# Patient Record
Sex: Female | Born: 2005 | Race: Black or African American | Hispanic: No | Marital: Single
Health system: Midwestern US, Community
[De-identification: ages and names within clinical notes are randomized; demographics above are authoritative.]

## PROBLEM LIST (undated history)

## (undated) DIAGNOSIS — Z021 Encounter for pre-employment examination: Secondary | ICD-10-CM

## (undated) DIAGNOSIS — R102 Pelvic and perineal pain: Principal | ICD-10-CM

## (undated) DIAGNOSIS — R112 Nausea with vomiting, unspecified: Principal | ICD-10-CM

## (undated) DIAGNOSIS — S62623A Displaced fracture of medial phalanx of left middle finger, initial encounter for closed fracture: Secondary | ICD-10-CM

## (undated) DIAGNOSIS — M79645 Pain in left finger(s): Secondary | ICD-10-CM

## (undated) DIAGNOSIS — Z0289 Encounter for other administrative examinations: Secondary | ICD-10-CM

---
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
## 2024-02-12 DIAGNOSIS — N644 Mastodynia: Secondary | ICD-10-CM

## 2024-02-12 NOTE — Discharge Instructions (Addendum)
(  1)  Acetaminophen  and/or Ibuprofen   (2)  Cephalexin for 7 days  (3)  U/S will call for outpatient U/S of left breast  (4)  call your PCP for appt for recheck ASAP of left breast pain and review U/S results.  Discuss referral to Dr Rory Collard from GYN.

## 2024-02-12 NOTE — ED Triage Notes (Signed)
 C/o pain to palpation to left breast x 5 days. Pt states she thought it was because of her 'period',states she just got over her menses. Denies being sexually  active. No redness noted, but very tender to touch

## 2024-02-12 NOTE — ED Notes (Signed)
 Patient is alert, oriented, calm and cooperative.   Patient ambulates with a steady gait independently.  Medication, discharge instructions, and follow up care reviewed with patient and family who verbalized understanding.  Vital signs are stable.  RN, patient, MD, and family in agreement with discharge plan, no safety concerns noted.

## 2024-02-12 NOTE — ED Provider Notes (Signed)
 Chief Complaint   Patient presents with    Breast Pain       This is a 18 year old female who is here with left breast pain.      History is obtained per the patient as well as review of her records.      According to records, she has history of adjustment disorder, dental disorder, leukopenia, dysmenorrhea and intermittent asthma.      She adamantly denied being sexually active.      She noted that over the last 5 days she has had increasing pain and swelling to the left breast area.  It is at the top.  She denies any injury or trauma.  She denies any nipple discharge.  She states that she has no symptoms on the right side.  She denies any shortness of breath or chest pain.  She denies any fevers, belly pain or dysuria.            Medical History:    Current Problem List:   Patient Active Problem List   Diagnosis    Adjustment disorder with depressed mood    Dental disorder    Leucopenia    Dysmenorrhea    Delayed immunizations    Mild intermittent asthma without complication    Closed nondisplaced fracture of middle phalanx of left little finger    Closed displaced fracture of middle phalanx of left little finger with delayed healing       Past Medical History (No pertinent Med/Surg/Social history unless listed below or reported in HPI):    No past medical history on file.    No past surgical history on file.    Social History     Socioeconomic History    Marital status: Single     Spouse name: Not on file    Number of children: Not on file    Years of education: Not on file    Highest education level: Not on file   Occupational History    Not on file   Tobacco Use    Smoking status: Never     Passive exposure: Never    Smokeless tobacco: Never   Vaping Use    Vaping status: Never Used   Substance and Sexual Activity    Alcohol use: Never    Drug use: Never    Sexual activity: Never   Other Topics Concern    Not on file   Social History Narrative    Born in [place], went to school consistently there  Grandmother  Fadumo Dom has been in [place] 20 years, pt and her mother joined her 2022  10th grade at [employer]  No hx tobacco, substance or etoh use     Social Drivers of Psychologist, prison and probation services Strain: Not on file   Food Insecurity: Not on file (04/08/2022)   Transportation Needs: Not on file   Physical Activity: Not on file   Stress: Not on file   Social Connections: Not on file   Intimate Partner Violence: Not on file   Housing Stability: Not on file       Family History (No pertinent history unless listed below or reported in HPI):    Family History   Problem Relation Age of Onset    Asthma Maternal Grandmother        Medications:    Patient medication list reviewed:  Patient's Medications   New Prescriptions    CEPHALEXIN (KEFLEX) 500 MG CAPSULE  Take 1 capsule by mouth 4 times daily for 7 days   Previous Medications    ACETAMINOPHEN  (TYLENOL ) 500 MG TABLET    Take 1 tablet by mouth 4 times daily as needed for Pain    ALBUTEROL  SULFATE HFA (PROVENTIL ;VENTOLIN ;PROAIR ) 108 (90 BASE) MCG/ACT INHALER    Inhale 2 puffs into the lungs every 6 hours as needed    CYPROHEPTADINE  (PERIACTIN ) 4 MG TABLET    1 tab PO once nightly    FAMOTIDINE  (PEPCID ) 20 MG TABLET    Take 1 tablet by mouth 2 times daily for 5 days    FERROUS SULFATE  (IRON 325) 325 (65 FE) MG TABLET    Take 1 tablet by mouth daily (with breakfast)    IBUPROFEN  (ADVIL ;MOTRIN ) 600 MG TABLET    Take 1 tablet by mouth 4 times daily as needed for Pain    NAPROXEN  (NAPROSYN ) 500 MG TABLET    Take 1 tablet by mouth 2 times daily as needed for Pain    NORELGESTROMIN -ETHINYL ESTRADIOL  (XULANE) 150-35 MCG/24HR    Place 1 patch onto the skin once a week    NORGESTIMATE -ETHINYL ESTRADIOL  (SPRINTEC 28) 0.25-35 MG-MCG PER TABLET    Take 1 tablet by mouth daily    SPACER/AERO-HOLDING CHAMBERS DEVI    1 Device by Does not apply route as needed (Use with inhaler for wheezing) Use the spacer any time your using your inhaler    VITAMIN D  (ERGOCALCIFEROL ) 1.25 MG (50000 UT) CAPS  CAPSULE    Take 1 capsule by mouth once a week   Modified Medications    No medications on file   Discontinued Medications    No medications on file       Anticoagulants / Antiplatelet medications:  This patient does not have an active medication from one of the medication groupers.    PDMP Review:       No data to display                Allergies:      Patient has no known allergies.    Review of Systems   Constitutional:  Negative for fever.   HENT:  Negative for sore throat.    Respiratory:  Negative for shortness of breath.    Cardiovascular:  Negative for chest pain.   Gastrointestinal:  Negative for abdominal pain.   Genitourinary:  Negative for dysuria.   Neurological:  Negative for headaches.         Vitals:    02/12/24 2018   BP: 94/60   Pulse: 70   Resp: 18   Temp: 97.4 F (36.3 C)   SpO2: 98%   Weight: 52.2 kg (115 lb)   Height: 1.676 m (5\' 6" )     Physical Exam  Vitals and nursing note reviewed.   Cardiovascular:      Rate and Rhythm: Normal rate.      Pulses: Normal pulses.   Musculoskeletal:         General: Tenderness present.      Cervical back: Normal range of motion.      Comments: In the left breast area proximally at the 11 o'clock position (as viewing the patient), there is tenderness and swelling without fluctuance.  There is no peau d'orange.  There is no nipple discharge.  There is no erythema or warmth.   Skin:     General: Skin is warm.      Capillary Refill: Capillary refill takes less than 2 seconds.  Medical Decision Making:    Differential Diagnosis:    MDM:          Medical Decision Making  This is a 18 year old female who is here with 5 days of nontraumatic left breast pain.      Vital signs show temperature 97.4 with heart rate 70 and blood pressure 94/60.  She is 98% on room air.      Clinical exam shows an alert female in no distress.  I obtained consent for examination of the breast area.  With a nurse chaperone, I was able to visualize the area of tenderness.  It is at  the 11 o'clock position.  There is tenderness and a mass that is soft that was palpated.  There is no fluctuance.  There is no erythema.  There is no put her on change or nipple discharge.      My differential diagnosis includes inflammation, infection such as mastitis.  I do not think she is septic.  She has no cardiac symptoms and no pulmonary symptoms.      I discussed my findings and concerns with the patient.  The plan will be for acetaminophen  and ibuprofen .  I will start the patient on cephalexin  500 mg 4 times a day for the next 7 days although I am not 100% convinced this is mastitis this should cover this.  I will schedule the patient for outpatient breast ultrasound to look for a mass.  She will need close follow with her PCP and will need to be re-evaluated and discuss referral to gyn.      Final discharge diagnosis left breast pain.    Amount and/or Complexity of Data Reviewed  Radiology: ordered.    Risk  Prescription drug management.        ED Course:         Procedures    Vital Signs for this visit:    Patient Vitals for the past 12 hrs:   Temp Pulse Resp BP SpO2   02/12/24 2018 97.4 F (36.3 C) 70 18 94/60 98 %       Lab findings during this visit (only abnormal values will be noted, if no value noted then the result was normal range):    Labs Reviewed - No data to display    Radiology studies during this visit:    US  BREASt COMPLETE LEFT    (Results Pending)       No results found.    Medications given in the ED:    Medications   ibuprofen  (ADVIL ;MOTRIN ) tablet 600 mg (has no administration in time range)   cephALEXin  (KEFLEX ) 500 MG 4 capsule *(TAKE HOME)* (has no administration in time range)       Diagnosis:    Final diagnoses:   Breast pain, left       Disposition:    Decision To Discharge 02/12/2024 09:14:10 PM    Discharge prescriptions and/or changes if applicable:       Medication List        START taking these medications      cephALEXin  500 MG capsule  Commonly known as: KEFLEX   Take 1  capsule by mouth 4 times daily for 7 days            CONTINUE taking these medications      Spacer/Aero-Holding Ismael Maria  1 Device by Does not apply route as needed (Use with inhaler for wheezing) Use the spacer any time your using your inhaler  ASK your doctor about these medications      acetaminophen  500 MG tablet  Commonly known as: TYLENOL   Take 1 tablet by mouth 4 times daily as needed for Pain     albuterol  sulfate HFA 108 (90 Base) MCG/ACT inhaler  Commonly known as: PROVENTIL ;VENTOLIN ;PROAIR      cyproheptadine  4 MG tablet  Commonly known as: PERIACTIN   1 tab PO once nightly     famotidine  20 MG tablet  Commonly known as: Pepcid   Take 1 tablet by mouth 2 times daily for 5 days     ferrous sulfate  325 (65 Fe) MG tablet  Commonly known as: IRON 325  Take 1 tablet by mouth daily (with breakfast)     ibuprofen  600 MG tablet  Commonly known as: ADVIL ;MOTRIN   Take 1 tablet by mouth 4 times daily as needed for Pain     naproxen  500 MG tablet  Commonly known as: NAPROSYN   Take 1 tablet by mouth 2 times daily as needed for Pain     norelgestromin -ethinyl estradiol  150-35 MCG/24HR  Commonly known as: XULANE  Place 1 patch onto the skin once a week     norgestimate -ethinyl estradiol  0.25-35 MG-MCG per tablet  Commonly known as: Sprintec 28  Take 1 tablet by mouth daily     vitamin D  1.25 MG (50000 UT) Caps capsule  Commonly known as: ERGOCALCIFEROL   Take 1 capsule by mouth once a week               Where to Get Your Medications        These medications were sent to [id number] - [place], [place] - [address] - P [phone] - F [phone]  [address] [place] [address]      Phone: [phone]   cephALEXin 500 MG capsule         Follow-up if applicable:    Melchor Spoon, FNP  [address]  [place] [address]  [phone]    Call       Coulombe, Fulton Job, MD  [address]  [address]  [place] [address]  [phone]    Call         Please note that portions of this  document were created using the M*Modal Fluency Direct dictation system.  Any inconsistencies or typographical errors may be the result of mis-transcription that persist in spite of proof-reading and should be addressed with the document creator.       Levis Reams, MD  02/13/24 [phone]

## 2024-02-13 ENCOUNTER — Inpatient Hospital Stay
Admit: 2024-02-13 | Discharge: 2024-02-13 | Disposition: A | Discharge status: Home / Self Care | Payer: MEDICAID | Attending: Emergency Medicine

## 2024-02-13 MED ORDER — CEPHALEXIN 500 MG PO CAPS
500 | ORAL_CAPSULE | Freq: Four times a day (QID) | ORAL | 0 refills | 7.00 days | Status: AC
Start: 2024-02-13 — End: 2024-02-19

## 2024-02-13 MED ORDER — CEPHALEXIN 500 MG PO CAPS
500 | ORAL | Status: DC
Start: 2024-02-13 — End: 2024-02-12
  Administered 2024-02-13: 01:00:00 via ORAL

## 2024-02-13 MED ORDER — IBUPROFEN 400 MG PO TABS
400 | ORAL | Status: AC
Start: 2024-02-13 — End: 2024-02-12
  Administered 2024-02-13: 01:00:00 600 mg via ORAL

## 2024-02-13 MED FILL — CEPHALEXIN 500 MG PO CAPS: 500 MG | ORAL | Qty: 4 | Fill #0

## 2024-02-13 MED FILL — IBUPROFEN 200 MG PO TABS: 200 MG | ORAL | Qty: 1 | Fill #0

## 2024-03-14 ENCOUNTER — Emergency Department: Admit: 2024-03-14 | Payer: MEDICAID | Primary: Family

## 2024-03-14 ENCOUNTER — Inpatient Hospital Stay
Admit: 2024-03-14 | Discharge: 2024-03-15 | Disposition: A | Discharge status: Home / Self Care | Payer: MEDICAID | Attending: Emergency Medicine

## 2024-03-14 DIAGNOSIS — S199XXA Unspecified injury of neck, initial encounter: Secondary | ICD-10-CM

## 2024-03-14 MED ORDER — IBUPROFEN 400 MG PO TABS
400 | ORAL | Status: AC
Start: 2024-03-14 — End: 2024-03-14
  Administered 2024-03-14: 400 mg via ORAL

## 2024-03-14 MED FILL — IBUPROFEN 400 MG PO TABS: 400 MG | ORAL | Qty: 1

## 2024-03-14 NOTE — ED Triage Notes (Signed)
 CC of falling down interior stairs this morning, no LOC, denies numbness/tingling, took tylenol  and now neck pain on R side shoulder pain as well, states can't raise arm above head. Independently ambulatory into triage with steady gait. VSS

## 2024-03-14 NOTE — ED Notes (Signed)
 Patient is alert, oriented, calm, and acting appropriately for their age.    Patient ambulates with a steady gait independently.    Medication, discharge instructions, and follow up care reviewed with Pt who verbalized understanding.    Vital signs are stable.    Paramedic, Provider, and Pt in agreement with discharge plan, no safety concerns noted.

## 2024-03-14 NOTE — Discharge Instructions (Signed)
 Preliminary review of x-ray shows no fracture.  Pain is likely due from the muscles and ligaments supporting her neck.  Try some warm compresses and massage.  Continue ibuprofen  over-the-counter 400 mg every 6-8 hours.  Muscle relaxant prescription was sent to pharmacy.  Use only before you are about to go to sleep.  A work note will be given to you.    Notify PCP or return to ER for any worsening symptoms.

## 2024-03-14 NOTE — ED Provider Notes (Signed)
 Chief Complaint:    HPI  18 year old here after fall.  She works night shifts.  She finished work at 6:00 a.m.SABRA  She went to bed she got up mid morning and slipped while she was on some stairs.  She did not fall down the stairs.  She caught herself with a hand rail and then she thinks her head and neck came in contact with either the wall or the railing.  She had some discomfort to her right neck.  She went back to bed.  She did not lose consciousness.  She took some Tylenol .  Mom tried massage.  She is having ongoing right-sided neck pain worse with movement better at rest.  Denies any weakness or numbness to her fingers or hands.  Denies any vision change denies any lower back pain or lower extremity pain.        Medical History:  Current Problem List:   Patient Active Problem List   Diagnosis    Adjustment disorder with depressed mood    Dental disorder    Leucopenia    Dysmenorrhea    Delayed immunizations    Mild intermittent asthma without complication    Closed nondisplaced fracture of middle phalanx of left little finger    Closed displaced fracture of middle phalanx of left little finger with delayed healing       Past Medical History:  No past medical history on file.    No past surgical history on file.    Social History     Socioeconomic History    Marital status: Single     Spouse name: Not on file    Number of children: Not on file    Years of education: Not on file    Highest education level: Not on file   Occupational History    Not on file   Tobacco Use    Smoking status: Never     Passive exposure: Never    Smokeless tobacco: Never   Vaping Use    Vaping status: Never Used   Substance and Sexual Activity    Alcohol use: Never    Drug use: Never    Sexual activity: Never   Other Topics Concern    Not on file   Social History Narrative    Born in [place], went to school consistently there  Grandmother Fadumo Eaves has been in [place] 20 years, pt and her mother joined her 2022  10th grade at [employer]  No  hx tobacco, substance or etoh use     Social Drivers of Psychologist, prison and probation services Strain: Not on file   Food Insecurity: Not on file (04/08/2022)   Transportation Needs: Not on file   Physical Activity: Not on file   Stress: Not on file   Social Connections: Not on file   Intimate Partner Violence: Not on file   Housing Stability: Not on file       Family History:  Family History   Problem Relation Age of Onset    Asthma Maternal Grandmother        Medications:  Patient medication list reviewed  Discharge Medication List as of 03/14/2024  9:11 PM        CONTINUE these medications which have NOT CHANGED    Details   norelgestromin -ethinyl estradiol  (XULANE) 150-35 MCG/24HR Place 1 patch onto the skin once a week, Disp-3 patch, R-3Normal      norgestimate -ethinyl estradiol  (SPRINTEC 28) 0.25-35 MG-MCG per tablet Take 1  tablet by mouth daily, Disp-3 packet, R-1Normal      ferrous sulfate  (IRON 325) 325 (65 Fe) MG tablet Take 1 tablet by mouth daily (with breakfast), Disp-90 tablet, R-1Normal      vitamin D  (ERGOCALCIFEROL ) 1.25 MG (50000 UT) CAPS capsule Take 1 capsule by mouth once a week, Disp-12 capsule, R-1Normal      naproxen  (NAPROSYN ) 500 MG tablet Take 1 tablet by mouth 2 times daily as needed for Pain, Disp-60 tablet, R-0Normal      acetaminophen  (TYLENOL ) 500 MG tablet Take 1 tablet by mouth 4 times daily as needed for Pain, Disp-360 tablet, R-1Normal      ibuprofen  (ADVIL ;MOTRIN ) 600 MG tablet Take 1 tablet by mouth 4 times daily as needed for Pain, Disp-40 tablet, R-1Normal      cyproheptadine  (PERIACTIN ) 4 MG tablet 1 tab PO once nightly, Disp-90 tablet, R-0Normal      famotidine  (PEPCID ) 20 MG tablet Take 1 tablet by mouth 2 times daily for 5 days, Disp-10 tablet, R-0Normal      Spacer/Aero-Holding Chambers DEVI AS NEEDED Starting Sun 12/04/2022, Disp-1 each, R-0, NormalUse the spacer any time your using your inhaler      albuterol  sulfate HFA (PROVENTIL ;VENTOLIN ;PROAIR ) 108 (90 Base) MCG/ACT inhaler  Inhale 2 puffs into the lungs every 6 hours as neededHistorical Med             Anticoagulants / Antiplatelet medications:  This patient does not have an active medication from one of the medication groupers.    Allergies:    Patient has no known allergies.    Review of Systems  See history of present illness for relevant review of systems.    ED Triage Vitals [03/14/24 1813]   BP Systolic BP Percentile Diastolic BP Percentile Temp Temp src Pulse Respirations SpO2   108/70 -- -- 98.8 F (37.1 C) -- 99 20 100 %      Height Weight - Scale         1.676 m (5' 6) 54.4 kg (120 lb)           Physical Exam  General appearance healthy appearing patient sitting up in chair she is holding her head towards the left.    Vital signs reviewed.  On palpation of the C-spine there was no localized pain on palpation of the midline of the spinous processes.  She has moderate pain on palpation of the trapezius.  There is a spasm going on.  She has decreased range of motion secondary to pain.  There was no pulsatile mass.  There was no skin break.  She has no pain on palpation of her shoulder or collarbone and full range of motion of her right arm and good grip strength.    Medical Decision Making  Amount and/or Complexity of Data Reviewed  Radiology: ordered.    Risk  Prescription drug management.      18 year old here with right-sided neck pain after a fall this morning.  There was no midline spinous process tenderness but there was moderate right paravertebral spasm/trapezius spasm.  There was no bruising.  There was no neuro surgical red flags distally.  She did not hit her head or pass out.  Plan to get a screening x-ray of her C-spine and give her some ibuprofen             ED Course:  ED Course as of 03/14/24 2125   Thu Mar 14, 2024   2038 C-spine x-ray was initially reviewed by me.  I see no obvious fracture.  Formal report still pending.     [RR]      ED Course User Index  [RR] Sigurd Tanda HERO, MD       Procedures    Vital  Signs for this visit:  Vitals:    03/14/24 1813   BP: 108/70   Pulse: 99   Resp: 20   Temp: 98.8 F (37.1 C)   SpO2: 100%   Weight: 54.4 kg (120 lb)   Height: 1.676 m (5' 6)       Lab orders and findings within the past 24 hours that I have personally reviewed and interpreted during this visit.   No results found for this or any previous visit (from the past 24 hours).    Recent radiology studies including this visit.  I have personally reviewed these studies and my personal interpretation, if available, is documented in the ED Course:  XR CERVICAL SPINE (2-3 VIEWS)    (Results Pending)       Medications ordered in the ED (also given if there is an associated admin time):  Medications   cyclobenzaprine  (FLEXERIL ) tablet 10 mg (has no administration in time range)   ibuprofen  (ADVIL ;MOTRIN ) tablet 400 mg (400 mg Oral Given 03/14/24 1959)       Diagnosis:  1. Injury of neck, initial encounter            Condition at disposition:  stable    DISPOSITION Discharge - Pending Orders Complete 03/14/2024 08:44:41 PM             Discharge prescriptions and/or changes if applicable:       Medication List        CONTINUE taking these medications      Spacer/Aero-Holding Raguel French  1 Device by Does not apply route as needed (Use with inhaler for wheezing) Use the spacer any time your using your inhaler            ASK your doctor about these medications      acetaminophen  500 MG tablet  Commonly known as: TYLENOL   Take 1 tablet by mouth 4 times daily as needed for Pain     albuterol  sulfate HFA 108 (90 Base) MCG/ACT inhaler  Commonly known as: PROVENTIL ;VENTOLIN ;PROAIR      cyproheptadine  4 MG tablet  Commonly known as: PERIACTIN   1 tab PO once nightly     famotidine  20 MG tablet  Commonly known as: Pepcid   Take 1 tablet by mouth 2 times daily for 5 days     ferrous sulfate  325 (65 Fe) MG tablet  Commonly known as: IRON 325  Take 1 tablet by mouth daily (with breakfast)     ibuprofen  600 MG tablet  Commonly known as:  ADVIL ;MOTRIN   Take 1 tablet by mouth 4 times daily as needed for Pain     naproxen  500 MG tablet  Commonly known as: NAPROSYN   Take 1 tablet by mouth 2 times daily as needed for Pain     norelgestromin -ethinyl estradiol  150-35 MCG/24HR  Commonly known as: XULANE  Place 1 patch onto the skin once a week     norgestimate -ethinyl estradiol  0.25-35 MG-MCG per tablet  Commonly known as: Sprintec 28  Take 1 tablet by mouth daily     vitamin D  1.25 MG (50000 UT) Caps capsule  Commonly known as: ERGOCALCIFEROL   Take 1 capsule by mouth once a week              Follow-up if  applicable:  Denton Elveria Pounds, FNP  [address]  [place] [address]  [phone]    Call in 3 days  As needed      Please note that portions of this document were created using the M*Modal Fluency Direct dictation system.  Any inconsistencies or typographical errors may be the result of mis-transcription that persist in spite of proof-reading and should be addressed with the document creator.      Sigurd Tanda HERO, MD  03/14/24 2125

## 2024-03-15 MED ORDER — CYCLOBENZAPRINE HCL 10 MG PO TABS
10 | Freq: Once | ORAL | Status: DC
Start: 2024-03-15 — End: 2024-03-14

## 2024-03-15 MED FILL — CYCLOBENZAPRINE HCL 10 MG PO TABS: 10 MG | ORAL | Qty: 1

## 2024-06-03 ENCOUNTER — Telehealth: Admit: 2024-06-03 | Discharge: 2024-06-03 | Primary: Family

## 2024-06-03 DIAGNOSIS — R102 Pelvic and perineal pain: Principal | ICD-10-CM

## 2024-06-03 MED ORDER — HYDROXYZINE HCL 25 MG PO TABS
25 | ORAL_TABLET | Freq: Three times a day (TID) | ORAL | 0 refills | Status: AC | PRN
Start: 2024-06-03 — End: 2024-06-13

## 2024-06-03 NOTE — Telephone Encounter (Signed)
 Pt name & DOB verified.     Name of the person calling: Chalon A Hartwell  Relationship to the patient: Self  CB#:  [phone]       What is the reason for the call?: Recurring of ovarian cyst - patient reports had taken birth control to mitigate this, but stopped taking BC as she didn't like how it made her feel, too much period, painful. Felt better for one month after stopping BC.    What symptoms are you experiencing?: Sharp pain 3-4 times a day in abdominal area    How long has this been happening? (Please include date of initial symptoms): 4 weeks     If there is pain, what is the pain level on a scale of 1-10 (10 being the worst pain you have ever experienced): Depends. 8 or 9     Did a fall or other type of injury occur? If so what happened?: No    Are they requesting an appointment? : Yes      Is there anything they would like the provider or nurse to know, that was not asked?: N/A        Please route completed note to the [facility] for further triage.

## 2024-06-03 NOTE — Progress Notes (Unsigned)
 [facility]   [address]  [place] [address]      VIRTUAL TELEHEALTH VISIT (Performed via Telephone or Audio & Video)     VIRTUAL TELEHEALTH VISIT (Performed via Telephone or Audio & Video)     VISIT TYPE:  Audio only (Telephone) - patient cannot do a video visit due to (lack of equipment, data or connectivity) CALL MOBILE -   Telephone Information:   Mobile [phone]     PROVIDER LOCATION:  Home  PATIENT LOCATION AT TIME OF VISIT:    HOME: [address]  [place] [address]    PARTICIPANT(S):  Patient    BILLING CODE:  Standard E/M Billing Codes:  Billing for this visit is not time based - Bill E/M Code within LOS activity    TOTAL TIME SPENT ON ENCOUNTER:  20 minutes    Review of electronic health record:  I specifically reviewed: Hospital Note(s), Labs, and Previous Office Encounters  Problems, Meds and Allergies were reviewed.  Patient gave verbal consent for this TeleHealth visit.             CHIEF COMPLAINT   Dorothy Nelson presents for a Tele-Health visit today   .  HISTORY OF PRESENT ILLNESS     VV conducted today at Dorothy Nelson request. Two identifiers used for Dorothy Nelson and they are aware this is a telehealth appointment and consent to conducted the visit with them today.     Pelvic pain x month  No new partners, denies STI rrisk, denies pregnancy  LMP one week ago   Not taking OCPs   Increased anxiety, not sleeping well    PHYSICAL EXAM   Alert and conversant without audible cough, wheeze or SOB, answering questions clearly and concisely, no slurred speech  ROS: see HPI    MEDICATIONS     Current Outpatient Medications   Medication Sig    hydrOXYzine  HCl (ATARAX ) 25 MG tablet Take 1 tablet by mouth every 8 hours as needed for Anxiety    norelgestromin -ethinyl estradiol  (XULANE) 150-35 MCG/24HR Place 1 patch onto the skin once a week    norgestimate -ethinyl estradiol  (SPRINTEC 28) 0.25-35 MG-MCG per tablet Take 1 tablet by mouth daily    ferrous sulfate  (IRON 325) 325 (65 Fe) MG tablet Take 1 tablet by mouth  daily (with breakfast)    vitamin D  (ERGOCALCIFEROL ) 1.25 MG (50000 UT) CAPS capsule Take 1 capsule by mouth once a week    naproxen  (NAPROSYN ) 500 MG tablet Take 1 tablet by mouth 2 times daily as needed for Pain (Patient not taking: Reported on 09/12/2023)    acetaminophen  (TYLENOL ) 500 MG tablet Take 1 tablet by mouth 4 times daily as needed for Pain (Patient not taking: Reported on 09/12/2023)    ibuprofen  (ADVIL ;MOTRIN ) 600 MG tablet Take 1 tablet by mouth 4 times daily as needed for Pain (Patient not taking: Reported on 09/12/2023)    cyproheptadine  (PERIACTIN ) 4 MG tablet 1 tab PO once nightly (Patient not taking: Reported on 06/08/2023)    famotidine  (PEPCID ) 20 MG tablet Take 1 tablet by mouth 2 times daily for 5 days    Spacer/Aero-Holding Raguel DEVI 1 Device by Does not apply route as needed (Use with inhaler for wheezing) Use the spacer any time your using your inhaler (Patient not taking: Reported on 09/12/2023)    albuterol  sulfate HFA (PROVENTIL ;VENTOLIN ;PROAIR ) 108 (90 Base) MCG/ACT inhaler Inhale 2 puffs into the lungs every 6 hours as needed (Patient not taking: Reported on  09/12/2023)     No current facility-administered medications for this visit.     @DCMEDS @    There are no discontinued medications.  ALLERGIES     No Known Allergies    ACTIVE MEDICAL PROBLEMS     There is no problem list on file for this patient.    SOCIAL HISTORY     Social History     Social History Narrative    Not on file     VITALS   There were no vitals filed for this visit. - There is no height or weight on file to calculate BMI.  ORDERS & MEDS (NEW)     No orders of the defined types were placed in this encounter.    ASSESSMENT AND PLAN       1. Pelvic pain  -     Urinalysis with Reflex Micro to Culture; Future  -     C.trachomatis N.gonorrhoeae DNA; Future  -     US  PELVIS COMPLETE; Future  -     Pregnancy, Urine; Future  2. Iron deficiency anemia, unspecified iron deficiency anemia type  -     Ferritin; Future  -      Iron Profile; Future  -     CBC with Auto Differential; Future  3. Anxiety  -     hydrOXYzine  HCl (ATARAX ) 25 MG tablet; Take 1 tablet by mouth every 8 hours as needed for Anxiety, Disp-30 tablet, R-0Normal          Follow up:  No follow-ups on file.     Future Appointments   Date Time Provider Department Center   07/15/2024  1:00 PM Denton Elveria Pounds, FNP [facility]       Delon Sites, [place]  06/03/2024

## 2024-06-03 NOTE — Telephone Encounter (Signed)
 Patient name and DOB verified.   Pt declined any need for an interpreter.      Pt had called the office earlier

## 2024-06-03 NOTE — Telephone Encounter (Signed)
 NURSING DOCUMENTATION:  Pt name & dob verified at start of call.  CC:  Acute Lower one sided abdominal pain RLQ. 8/10, sharp, intermittent--now for 4 weeks.   Reports baseline Abdominal Pain for 1 week before menses  Duration: 4 weeks  Associated Symptoms: occasional nausea. Not sexually active.  dizziness, she reports exertional dyspnea.    LMP:  3 weeks ago (unsure of exact date), reports LMP was normal on time.  She does report heavy flow and lots of blood clots.  Bled for 4-5 days changes pad soaked every 3 hours for the first 2 days of her menses.  Denies:  chest pains, palpitation  Has tried:   Other: started taking OCP's, took them only for a month then stopped.    --  Pt has appt with [facility] already scheduled.  Will send triage note to [facility] provider.   --  Thank-you.    Ike HUNT RN float nurse working for [employer] today (from ext [phone])

## 2024-06-11 ENCOUNTER — Inpatient Hospital Stay: Primary: Family

## 2024-06-11 DIAGNOSIS — R102 Pelvic and perineal pain: Principal | ICD-10-CM

## 2024-06-12 ENCOUNTER — Inpatient Hospital Stay: Primary: Family

## 2024-06-12 ENCOUNTER — Encounter

## 2024-06-12 ENCOUNTER — Inpatient Hospital Stay: Admit: 2024-06-12 | Attending: Family | Primary: Family

## 2024-06-12 DIAGNOSIS — R102 Pelvic and perineal pain: Secondary | ICD-10-CM

## 2024-06-12 NOTE — Other (Signed)
 Normal pelvic US  please make pt aware Elveria Buren Guys, FNP

## 2024-06-13 NOTE — Other (Signed)
 Patient has been notified of results.

## 2024-06-20 ENCOUNTER — Encounter: Payer: Auto Insurance (includes no fault) | Primary: Family

## 2024-06-23 ENCOUNTER — Emergency Department: Admit: 2024-06-24 | Payer: Auto Insurance (includes no fault) | Primary: Family

## 2024-06-23 DIAGNOSIS — R0789 Other chest pain: Secondary | ICD-10-CM
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
## 2024-06-24 ENCOUNTER — Inpatient Hospital Stay
Admit: 2024-06-24 | Discharge: 2024-06-24 | Disposition: A | Discharge status: Home / Self Care | Payer: Auto Insurance (includes no fault) | Arrived: AM | Attending: Emergency Medicine

## 2024-06-24 MED ORDER — ACETAMINOPHEN 325 MG PO TABS
325 | ORAL | Status: AC
Start: 2024-06-24 — End: 2024-06-24
  Administered 2024-06-24: 05:00:00 975 mg via ORAL

## 2024-06-24 MED ORDER — IBUPROFEN 400 MG PO TABS
400 | ORAL | Status: AC
Start: 2024-06-24 — End: 2024-06-24
  Administered 2024-06-24: 05:00:00 800 mg via ORAL

## 2024-06-24 MED FILL — IBUPROFEN 400 MG PO TABS: 400 mg | ORAL | Qty: 2 | Fill #0

## 2024-06-24 MED FILL — ACETAMINOPHEN 325 MG PO TABS: 325 mg | ORAL | Qty: 3 | Fill #0

## 2024-06-24 NOTE — ED Notes (Signed)
 Patient is alert, oriented, calm and cooperative.   Patient ambulates with a steady gait independently.  Medication, discharge instructions, and follow up care reviewed with patient and family who verbalized understanding.  Vital signs are stable.  RN, patient, MD, and family in agreement with discharge plan, no safety concerns noted.

## 2024-06-24 NOTE — Discharge Instructions (Signed)
 You may take Tylenol  and/or ibuprofen  as needed for pain.  Please follow-up with your primary doctor, call for the next available appointment.  Please return to the emergency room if you are worse in any way.
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
## 2024-07-01 NOTE — Telephone Encounter (Signed)
"  Patient name and DOB verified.      Patient is still having chest and lower back pain. When she went to the hospital they told her is was just bruising. She's taking Tylenol  and Ibuprofen  OTC. Patient wanted to be seen to see if there was anything that can be done for her lower back, it feels tight and wondering what she can do to relax or release the tightness in her back.     Patient schedule an appointment with [name] for tomorrow at 3:00pm.     Dorothy Nelson App, RMA, Practice Quality Specialist 07/01/2024 2:25 PM    "

## 2024-07-02 ENCOUNTER — Ambulatory Visit
Admit: 2024-07-02 | Discharge: 2024-07-02 | Payer: Auto Insurance (includes no fault) | Attending: Family | Primary: Family

## 2024-07-02 VITALS — BP 105/60 | HR 78 | Wt 121.0 lb

## 2024-07-02 DIAGNOSIS — R0789 Other chest pain: Principal | ICD-10-CM

## 2024-07-02 MED ORDER — CYCLOBENZAPRINE HCL 10 MG PO TABS
10 | ORAL_TABLET | Freq: Three times a day (TID) | ORAL | 0 refills | 10.00000 days | Status: DC | PRN
Start: 2024-07-02 — End: 2024-07-15
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
## 2024-07-09 NOTE — Progress Notes (Signed)
"  Last CPE:04/08/22    PREVENTIVE CARE - SCREENINGS  Breast Cancer: Not indicated Pt age 18   Cervical Cancer:  No cervical cancer screening on file   Cervical Cancer: Not indicated Pt age 40   Colon cancer:  Not indicated Pt age 11   Lung cancer: Not indicated Pt age 46, pt non smoker   Smoking Hx  reports that she has never smoked. She has never been exposed to tobacco smoke. She has never used smokeless tobacco.   AAA:  Not indicated Pt age 28, pt non smoker, no known family history   Osteoporosis: DEXA Result (most recent):  No results found for this or any previous visit from the past 3650 days.     Osteoporosis: Not indicated Pt age 89     Influenza: Due Done 08/31/23   Pneumonia: Not indicated Pt age 18   Shingrix: Not indicated Pt age 63   Td/Tdap: Up to date Done 12/31/21 Tdap   COVID 19: Due 2 doses, most recent 10/14/20   RSV Not indicated Pt age 70     Hepatitis C: Up to date   Lab Results   Component Value Date    HEPAIGM NONREACTIVE 10/07/2020    HEPBCAB NONREACTIVE 10/07/2020    HEPCAB NONREACTIVE 10/07/2020       Lipids:  No results found for: CHOL, TRIG, HDL, LDL, VLDL, CHOLHDLRATIO Pt age 80     Diabetes: Not indicated Pt non diabetic  Lab Results   Component Value Date    NA 138 11/14/2023    K 3.7 11/14/2023    CL 107 11/14/2023    CO2 29 11/14/2023    BUN 6 (L) 11/14/2023    CREATININE 0.63 11/14/2023    GLUCOSE 94 11/14/2023    CALCIUM 8.8 (L) 11/14/2023    BILITOT 0.30 11/14/2023    ALKPHOS 60 (L) 11/14/2023    AST 15 11/14/2023    ALT 17 (L) 11/14/2023    LABGLOM >90 11/14/2023    GFRAA >60 10/07/2020      Fasting BS 05/20/23@10 :49 Glu-71 low, 12/28/21@4 :14 Glu-89   A1C No results found for: LABA1C, HBA1C, HGBA1CEXT, HBA1CPOC   DM eye exam No diabetic eye exam on file   DM foot exam There are no preventive care reminders to display for this patient.   Microalb No components found for: LABMICR, [id number]       Active orders for HM gaps: Pregnancy Urine, CBC, Iron Profile,  Ferritin, C. Trachomatis N. Gonorrheae DNA, Urinalysis  HIN reviewed: Yes           "

## 2024-07-15 ENCOUNTER — Inpatient Hospital Stay: Admit: 2024-07-15 | Payer: MEDICAID | Primary: Family

## 2024-07-15 ENCOUNTER — Ambulatory Visit
Admit: 2024-07-15 | Discharge: 2024-07-15 | Payer: Auto Insurance (includes no fault) | Attending: Family | Primary: Family

## 2024-07-15 VITALS — BP 102/70 | HR 82 | Ht 66.0 in | Wt 121.8 lb

## 2024-07-15 DIAGNOSIS — Z0000 Encounter for general adult medical examination without abnormal findings: Principal | ICD-10-CM

## 2024-07-15 DIAGNOSIS — D509 Iron deficiency anemia, unspecified: Secondary | ICD-10-CM
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
## 2024-07-15 NOTE — Progress Notes (Signed)
"  Last CPE:04/08/22    PREVENTIVE CARE - SCREENINGS  Breast Cancer: Not indicated Pt age 18   Cervical Cancer:  No cervical cancer screening on file   Cervical Cancer: Not indicated Pt age 40   Colon cancer:  Not indicated Pt age 11   Lung cancer: Not indicated Pt age 46, pt non smoker   Smoking Hx  reports that she has never smoked. She has never been exposed to tobacco smoke. She has never used smokeless tobacco.   AAA:  Not indicated Pt age 28, pt non smoker, no known family history   Osteoporosis: DEXA Result (most recent):  No results found for this or any previous visit from the past 3650 days.     Osteoporosis: Not indicated Pt age 89     Influenza: Due Done 08/31/23   Pneumonia: Not indicated Pt age 18   Shingrix: Not indicated Pt age 63   Td/Tdap: Up to date Done 12/31/21 Tdap   COVID 19: Due 2 doses, most recent 10/14/20   RSV Not indicated Pt age 70     Hepatitis C: Up to date   Lab Results   Component Value Date    HEPAIGM NONREACTIVE 10/07/2020    HEPBCAB NONREACTIVE 10/07/2020    HEPCAB NONREACTIVE 10/07/2020       Lipids:  No results found for: CHOL, TRIG, HDL, LDL, VLDL, CHOLHDLRATIO Pt age 80     Diabetes: Not indicated Pt non diabetic  Lab Results   Component Value Date    NA 138 11/14/2023    K 3.7 11/14/2023    CL 107 11/14/2023    CO2 29 11/14/2023    BUN 6 (L) 11/14/2023    CREATININE 0.63 11/14/2023    GLUCOSE 94 11/14/2023    CALCIUM 8.8 (L) 11/14/2023    BILITOT 0.30 11/14/2023    ALKPHOS 60 (L) 11/14/2023    AST 15 11/14/2023    ALT 17 (L) 11/14/2023    LABGLOM >90 11/14/2023    GFRAA >60 10/07/2020      Fasting BS 05/20/23@10 :49 Glu-71 low, 12/28/21@4 :14 Glu-89   A1C No results found for: LABA1C, HBA1C, HGBA1CEXT, HBA1CPOC   DM eye exam No diabetic eye exam on file   DM foot exam There are no preventive care reminders to display for this patient.   Microalb No components found for: LABMICR, [id number]       Active orders for HM gaps: Pregnancy Urine, CBC, Iron Profile,  Ferritin, C. Trachomatis N. Gonorrheae DNA, Urinalysis  HIN reviewed: Yes           "

## 2024-07-16 LAB — CBC WITH AUTO DIFFERENTIAL
Basophils %: 0 % (ref 0–2)
Basophils Absolute: 0 K/UL (ref 0.0–0.1)
Eosinophils %: 1 % (ref 0–5)
Eosinophils Absolute: 0.03 K/UL (ref 0.0–0.4)
Hematocrit: 36.5 % — ABNORMAL LOW (ref 37.0–47.0)
Hemoglobin: 12 g/dL (ref 12.0–16.0)
Immature Granulocytes %: 0 % (ref 0.0–0.6)
Immature Granulocytes Absolute: 0 K/UL (ref 0.00–0.04)
Lymphocytes Absolute: 1.53 K/UL (ref 1.2–3.7)
Lymphocytes: 55 % — ABNORMAL HIGH (ref 14–46)
MCH: 30.3 pg (ref 27.0–31.0)
MCHC: 32.9 g/dL — ABNORMAL LOW (ref 33.0–37.0)
MCV: 92.2 FL (ref 80.0–94.0)
MPV: 11 FL — ABNORMAL HIGH (ref 7.4–10.4)
Monocytes %: 12 % (ref 5–12)
Monocytes Absolute: 0.34 K/UL (ref 0.2–1.0)
Neutrophils Absolute: 0.9 K/UL — ABNORMAL LOW (ref 1.6–6.1)
Nucleated RBCs: 0 /100{WBCs}
Platelet Estimate: ADEQUATE
Platelets: 246 K/uL (ref 130–400)
RBC: 3.96 M/uL — ABNORMAL LOW (ref 4.20–5.40)
RDW: 14.4 % (ref 11.5–14.5)
Seg Neutrophils: 32 % — ABNORMAL LOW (ref 47–80)
WBC: 2.8 K/uL — ABNORMAL LOW (ref 4.5–10.9)
nRBC: 0 K/uL

## 2024-07-18 ENCOUNTER — Encounter: Payer: Auto Insurance (includes no fault) | Primary: Family

## 2024-07-18 ENCOUNTER — Telehealth: Admit: 2024-07-18 | Discharge: 2024-07-18 | Payer: Auto Insurance (includes no fault) | Primary: Family

## 2024-07-18 DIAGNOSIS — Z0000 Encounter for general adult medical examination without abnormal findings: Principal | ICD-10-CM

## 2024-07-18 NOTE — Progress Notes (Signed)
 "[facility]   [address]  [place] [address]      VIRTUAL TELEHEALTH VISIT (Performed via Telephone or Audio & Video)     VISIT TYPE:  Audio only (Telephone) - patient cannot do a video visit due to (lack of equipment, data or connectivity) CALL MOBILE -   Telephone Information:   Mobile      PROVIDER LOCATION:  HOME  PATIENT LOCATION AT TIME OF VISIT:    HOME  PARTICIPANT(S): Patient    BILLING CODE:  Standard E/M Billing Codes:  Billing for this visit is not time based - Bill E/M Code within LOS activity  TOTAL TIME SPENT ON ENCOUNTER:  20    Review of electronic health record:  I specifically reviewed: Labs and Previous Office Encounters  Problems, Meds and Allergies were reviewed.  Patient gave verbal consent for this TeleHealth visit.       CHIEF COMPLAINT   Pt presents for a Tele-Health visit today   .  HISTORY OF PRESENT ILLNESS     VV conducted today at pt request. Two identifiers used for pt and they are aware this is a telehealth appointment and consent to conducted the visit with them today.     [place]i interpreter utilize for visit.     Reviewed labs with patient.   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  Discussed potential causes for this.   Patient did have orders in for additional testing which were not done. Discussed will reorder everything besides pregnancy test as patient has been having regular periods.     PHYSICAL EXAM   Alert and conversant without audible cough, wheeze or SOB, answering questions clearly and concisely, no slurred speech  ROS: see hpi    MEDICATIONS     Current Outpatient Medications   Medication Sig    norelgestromin -ethinyl estradiol  (XULANE) 150-35 MCG/24HR Place 1 patch onto the skin once a week (Patient not taking: Reported on 07/15/2024)    acetaminophen  (TYLENOL ) 500 MG tablet Take 1 tablet by mouth 4 times daily as needed for Pain (Patient not taking: Reported on 07/15/2024)    albuterol  sulfate HFA (PROVENTIL ;VENTOLIN ;PROAIR ) 108 (90 Base) MCG/ACT inhaler  Inhale 2 puffs into the lungs every 6 hours as needed     No current facility-administered medications for this visit.         There are no discontinued medications.  ALLERGIES     No Known Allergies    ACTIVE MEDICAL PROBLEMS     There is no problem list on file for this patient.    SOCIAL HISTORY     Social History     Social History Narrative    Not on file     VITALS   There were no vitals filed for this visit. - There is no height or weight on file to calculate BMI.  ORDERS & MEDS (NEW)     No orders of the defined types were placed in this encounter.    ASSESSMENT AND PLAN       1. Preventative health care  -     Iron Profile; Future  -     Ferritin; Future  -     C.trachomatis N.gonorrhoeae DNA; Future  -     Urinalysis with Reflex to Culture; Future  -     Vitamin B12; Future  2. Iron deficiency anemia, unspecified iron deficiency anemia type  -     Iron Profile; Future  -  Ferritin; Future  -     Vitamin B12; Future          Follow up:  No follow-ups on file.     Future Appointments   Date Time Provider Department Center   08/05/2024 11:30 AM [place] Elveria Pounds, FNP [facility] AMB       Calton Pippin, APRN - NP  07/18/2024    "

## 2024-08-05 ENCOUNTER — Encounter: Payer: Auto Insurance (includes no fault) | Attending: Family | Primary: Family

## 2024-08-29 ENCOUNTER — Inpatient Hospital Stay
Admit: 2024-08-29 | Discharge: 2024-08-29 | Disposition: A | Discharge status: Home / Self Care | Arrived: VH | Attending: Emergency Medicine

## 2024-08-29 DIAGNOSIS — R112 Nausea with vomiting, unspecified: Principal | ICD-10-CM

## 2024-08-29 LAB — COVID-19, FLU A/B, AND RSV COMBO
Influenza A by PCR: NEGATIVE
Influenza B by PCR: NEGATIVE
RSV by PCR: NEGATIVE
SARS-CoV-2, PCR: NEGATIVE

## 2024-08-29 LAB — CBC WITH AUTO DIFFERENTIAL
Basophils %: 0.3 % (ref 0–2)
Basophils Absolute: 0.01 K/UL (ref 0.0–0.1)
Eosinophils %: 0.3 % (ref 0–5)
Eosinophils Absolute: 0.01 K/UL (ref 0.0–0.4)
Hematocrit: 37.1 % (ref 37.0–47.0)
Hemoglobin: 12.5 g/dL (ref 12.0–16.0)
Immature Granulocytes %: 0 % (ref 0.0–0.6)
Immature Granulocytes Absolute: 0 K/UL (ref 0.00–0.04)
Lymphocytes Absolute: 1 K/UL — ABNORMAL LOW (ref 1.2–3.7)
Lymphocytes: 32.7 % (ref 14–46)
MCH: 30.2 pg (ref 27.0–31.0)
MCHC: 33.7 g/dL (ref 33.0–37.0)
MCV: 89.6 FL (ref 80.0–94.0)
MPV: 10 FL (ref 7.4–10.4)
Monocytes %: 9.2 % (ref 5–12)
Monocytes Absolute: 0.28 K/UL (ref 0.2–1.0)
Neutrophils Absolute: 1.76 K/UL (ref 1.6–6.1)
Nucleated RBCs: 0 /100{WBCs}
Platelets: 259 K/uL (ref 130–400)
RBC: 4.14 M/uL — ABNORMAL LOW (ref 4.20–5.40)
RDW: 14.2 % (ref 11.5–14.5)
Seg Neutrophils: 57.5 % (ref 47–80)
WBC: 3.1 K/uL — ABNORMAL LOW (ref 4.5–10.9)
nRBC: 0 K/uL

## 2024-08-29 LAB — URINALYSIS WITH REFLEX TO CULTURE
Bilirubin, Urine: NEGATIVE
Blood, Urine: NEGATIVE
Glucose, Ur: NORMAL mg/dL
Ketones, Urine: NEGATIVE mg/dL
Leukocyte Esterase, Urine: NEGATIVE uL
Nitrite, Urine: NEGATIVE
Protein, Urine: NEGATIVE mg/dL
Specific Gravity, UA: 1.012 (ref 1.008–1.030)
Urobilinogen, Urine: NORMAL EU/dL
pH, Urine: 6 (ref 5.0–8.0)

## 2024-08-29 LAB — COMPREHENSIVE METABOLIC PANEL
ALT: 20 U/L (ref 19–49)
AST: 22 U/L (ref 0–26)
Albumin: 3.7 g/dL — ABNORMAL LOW (ref 3.8–5.6)
Alk Phosphatase: 78 U/L — ABNORMAL LOW (ref 82–169)
BUN: 6 mg/dL — ABNORMAL LOW (ref 7–22)
CO2: 24 mmol/L (ref 21–32)
Calcium: 8.4 mg/dL — ABNORMAL LOW (ref 9.0–10.7)
Chloride: 109 mmol/L — ABNORMAL HIGH (ref 98–108)
Creatinine: 0.67 mg/dL (ref 0.55–1.10)
Est, Glom Filt Rate: >90 ml/min/1.73m2 (ref >60)
Glucose: 91 mg/dL (ref 74–106)
Potassium: 3.4 mmol/L (ref 3.4–5.1)
Sodium: 139 mmol/L (ref 136–145)
Total Bilirubin: 0.2 mg/dL (ref 0.00–1.00)
Total Protein: 7.7 g/dL (ref 6.4–8.6)

## 2024-08-29 LAB — HCG, SERUM, QUALITATIVE: Preg, Serum: NEGATIVE

## 2024-08-29 LAB — LIPASE: Lipase: 27 U/L (ref 13–75)

## 2024-08-29 MED ORDER — ONDANSETRON HCL 4 MG/2ML IJ SOLN
4 | Freq: Once | INTRAMUSCULAR | Status: AC
Start: 2024-08-29 — End: 2024-08-29
  Administered 2024-08-29: 09:00:00 4 mg via INTRAVENOUS

## 2024-08-29 MED ORDER — SODIUM CHLORIDE 0.9 % IV BOLUS
0.9 | Freq: Once | INTRAVENOUS | Status: AC
Start: 2024-08-29 — End: 2024-08-29
  Administered 2024-08-29: 09:00:00 1000 mL via INTRAVENOUS

## 2024-08-29 MED ORDER — KETOROLAC TROMETHAMINE 30 MG/ML IJ SOLN
30 | Freq: Once | INTRAMUSCULAR | Status: AC
Start: 2024-08-29 — End: 2024-08-29
  Administered 2024-08-29: 09:00:00 30 mg via INTRAVENOUS

## 2024-08-29 MED ORDER — ONDANSETRON 4 MG PO TBDP
4 | ORAL_TABLET | Freq: Three times a day (TID) | ORAL | 0 refills | 7.00000 days | Status: AC | PRN
Start: 2024-08-29 — End: ?

## 2024-08-29 MED FILL — SODIUM CHLORIDE 0.9 % IV SOLN: 0.9 % | INTRAVENOUS | Qty: 1000 | Fill #0

## 2024-08-29 MED FILL — KETOROLAC TROMETHAMINE 30 MG/ML IJ SOLN: 30 mg/mL | INTRAMUSCULAR | Qty: 1 | Fill #0

## 2024-08-29 MED FILL — ONDANSETRON HCL 4 MG/2ML IJ SOLN: 4 MG/2ML | INTRAMUSCULAR | Qty: 2 | Fill #0

## 2024-08-29 NOTE — ED Provider Notes (Signed)
 "[facility] EMERGENCY DEPARTMENT  EMERGENCY DEPARTMENT ENCOUNTER      Pt Name: Dorothy Nelson  MRN: [id number]  Birthdate July 29, 2006  Date of evaluation: 08/29/2024  Provider: Toribio Karsten Gess, MD    CHIEF COMPLAINT       Chief Complaint   Patient presents with    Abdominal Pain    Vomiting         HISTORY OF PRESENT ILLNESS   (Location/Symptom, Timing/Onset, Context/Setting, Quality, Duration, Modifying Factors, Severity)  Note limiting factors.   Dorothy Nelson is a 18 y.o. female who presents to the emergency department     18 year old female presents to the emergency department with primary complaint of abdominal pain.  States this has been ongoing for the past 3 days and has been generalized.  Started vomiting yesterday.  Had 1 episode of diarrhea today.  States family members are sick at home with upper respiratory type symptoms.  She is also endorsing some intermittent headache.  No fever chills.  Not endorsing any neck pain.  Has had some intermittent headaches without vision changes.  No shortness of breath cough chest pain or pressure.  No dysuria hematuria.  States no chance of pregnancy.  Has been tolerating some p.o. intake.  Has been getting some relief with vomiting episodes although now the pain seems to be more constant.  No prior abdominal surgeries        Nursing Notes were reviewed.    REVIEW OF SYSTEMS    (2-9 systems for level 4, 10 or more for level 5)     Review of Systems    Except as noted above the remainder of the review of systems was reviewed and negative.       PAST MEDICAL HISTORY   History reviewed. No pertinent past medical history.      SURGICAL HISTORY     History reviewed. No pertinent surgical history.      CURRENT MEDICATIONS       Previous Medications    ACETAMINOPHEN  (TYLENOL ) 500 MG TABLET    Take 1 tablet by mouth 4 times daily as needed for Pain    ALBUTEROL  SULFATE HFA (PROVENTIL ;VENTOLIN ;PROAIR ) 108 (90 BASE) MCG/ACT INHALER    Inhale 2 puffs into the lungs every 6 hours as  needed    NORELGESTROMIN -ETHINYL ESTRADIOL  (XULANE) 150-35 MCG/24HR    Place 1 patch onto the skin once a week       ALLERGIES     Patient has no known allergies.    FAMILY HISTORY       Family History   Problem Relation Age of Onset    Hypertension Mother     No Known Problems Father     Asthma Maternal Grandmother           SOCIAL HISTORY       Social History     Socioeconomic History    Marital status: Single     Spouse name: None    Number of children: None    Years of education: None    Highest education level: None   Tobacco Use    Smoking status: Never     Passive exposure: Never    Smokeless tobacco: Never   Vaping Use    Vaping status: Never Used   Substance and Sexual Activity    Alcohol use: Never    Drug use: Never    Sexual activity: Never   Social History Narrative    Born in [place], went to  school consistently there      Grandmother Tena Linebaugh has been in [place] 20 years, pt and her mother joined her 2022      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 [employer]      No hx tobacco, substance or etoh use       SCREENINGS         Glasgow Coma Scale  Eye Opening: Spontaneous  Best Verbal Response: Oriented  Best Motor Response: Obeys commands  Glasgow Coma Scale Score: 15                     CIWA Assessment  BP: (!) 92/59  Pulse: (!) 102                 PHYSICAL EXAM    (up to 7 for level 4, 8 or more for level 5)     ED Triage Vitals [08/29/24 0258]   BP Girls Systolic BP Percentile Girls Diastolic BP Percentile Boys Systolic BP Percentile Boys Diastolic BP Percentile Temp Temp Source Pulse   (!) 92/59 -- -- -- -- 97.7 F (36.5 C) Oral (!) 102      Respirations SpO2 Height Weight - Scale       18 97 % 1.676 m (5' 6) 54.4 kg (120 lb)           Physical Exam  Vitals and nursing note reviewed.   Constitutional:       Appearance: Normal appearance.   HENT:      Head: Normocephalic and atraumatic.      Nose: Nose normal.   Eyes:      Extraocular Movements: Extraocular movements intact.      Conjunctiva/sclera:  Conjunctivae normal.   Cardiovascular:      Rate and Rhythm: Regular rhythm. Tachycardia present.      Pulses: Normal pulses.      Heart sounds: Normal heart sounds. No murmur heard.  Pulmonary:      Effort: Pulmonary effort is normal.      Breath sounds: Normal breath sounds.      Comments: No tachypnea or accessory muscle use.  No wheezing, rales, or rhonchi  Abdominal:      General: Abdomen is flat. Bowel sounds are normal.      Palpations: Abdomen is soft.      Comments: Generally tender to palpation throughout, no rebound or guarding, no masses pulsatile masses.  No specific point tenderness   Musculoskeletal:         General: No deformity. Normal range of motion.      Cervical back: Normal range of motion and neck supple.   Skin:     General: Skin is warm and dry.      Capillary Refill: Capillary refill takes less than 2 seconds.   Neurological:      General: No focal deficit present.      Mental Status: She is alert and oriented to person, place, and time. Mental status is at baseline.   Psychiatric:         Mood and Affect: Mood normal.         Behavior: Behavior normal.         DIAGNOSTIC RESULTS     EKG: All EKG's are interpreted by the Emergency Department Physician who either signs or Co-signs this chart in the absence of a cardiologist.        RADIOLOGY:   Non-plain film images such as CT, Ultrasound and MRI are  read by the radiologist. Plain radiographic images are visualized and preliminarily interpreted by the emergency physician with the below findings:      Interpretation per the Radiologist below, if available at the time of this note:    No orders to display             LABS:  Labs Reviewed   COVID-19, FLU A/B, AND RSV COMBO   CBC WITH AUTO DIFFERENTIAL   COMPREHENSIVE METABOLIC PANEL   LIPASE   HCG, SERUM, QUALITATIVE   URINALYSIS WITH REFLEX TO CULTURE       All other labs were within normal range or not returned as of this dictation.    EMERGENCY DEPARTMENT COURSE and DIFFERENTIAL  DIAGNOSIS/MDM:   Vitals:    Vitals:    08/29/24 0258   BP: (!) 92/59   Pulse: (!) 102   Resp: 18   Temp: 97.7 F (36.5 C)   TempSrc: Oral   SpO2: 97%   Weight: 54.4 kg (120 lb)   Height: 1.676 m (5' 6)         MDM  Number of Diagnoses or Management Options  Abdominal pain, unspecified abdominal location  Nausea and vomiting, unspecified vomiting type  Diagnosis management comments: DDx:  Includes but is not limited to gastritis, gastroenteritis, viral illness, URI, pregnancy, other    Prior records reviewed:    Medications:  Normal saline, Toradol , Zofran     Studies:  Lab work interpreted by myself shows no clinically significant abnormalities.  CBC is at baseline.  Negative pregnancy screen    Pulse oximetry was reviewed and shows no evidence of hypoxia as interpreted by me    Discussed the role of CT imaging of the abdomen pelvis, shared decision-making model was utilized and will withhold at this time.  No peritoneal signs on exam    Consults/Plan:  18 year old female presents to the emergency depart with abdominal pain for the past 3 days and vomiting with diarrhea over the past 24 hours.  Generally tender on exam without peritoneal signs.  Lab work showed no clinically significant abnormalities.  Was feeling better with fluids Toradol  and Zofran .  Was counseled on the importance of hydration at home.  Will be given a script for antiemetics.  Discussed likely viral etiology.  Will follow with PCP in the next day or 2 for re-evaluation and return to the ED for any new or worsening symptoms.    Diagnosis/Disposition:  Abdominal pain  Nausea and vomiting   Discharge, stable at discharge       Amount and/or Complexity of Data Reviewed  Decide to obtain previous medical records or to obtain history from someone other than the patient: yes          REASSESSMENT     ED Course as of 08/29/24 0550   Thu Aug 29, 2024   [id number] Patient is feeling better on re-evaluation.  Pending urinalysis [DD]   [phone] Patient comfortable  with discharge at this time.  Urinalysis shows no evidence of acute urinary tract infection pain and nausea have been improving [DD]      ED Course User Index  [DD] Marny Smethers, Toribio Falter, MD         CRITICAL CARE TIME       CONSULTS:  None    PROCEDURES:  Unless otherwise noted below, none     Procedures        FINAL IMPRESSION    No diagnosis found.      DISPOSITION/PLAN  DISPOSITION        PATIENT REFERRED TO:  No follow-up provider specified.    DISCHARGE MEDICATIONS:  New Prescriptions    No medications on file     Controlled Substances Monitoring:          No data to display                (Please note that portions of this note were completed with a voice recognition program.  Efforts were made to edit the dictations but occasionally words are mis-transcribed.)    Toribio Karsten Gess, MD (electronically signed)  Attending Emergency Physician            Gess Toribio Karsten, MD  08/29/24 [phone]    "

## 2024-08-29 NOTE — ED Triage Notes (Signed)
"  Sharp epigastric pain and vomiting 24 hours. Unable to hold down water. Reports headache and feeling light headed. Took tylenol  around 0100. Unsure if febrile at home.   "

## 2024-08-29 NOTE — ED Notes (Signed)
 Patient is alert, oriented, calm and cooperative.   Patient ambulates with a steady gait independently.  Medication, discharge instructions, and follow up care reviewed with patient and family who verbalized understanding.  Vital signs are stable.  RN, patient, MD, and family in agreement with discharge plan, no safety concerns noted.

## 2024-08-30 NOTE — Telephone Encounter (Signed)
 A user error has taken place: encounter opened in error, closed for administrative reasons.

## 2024-09-11 MED ORDER — AMITRIPTYLINE HCL 10 MG PO TABS
10 | ORAL_TABLET | Freq: Every evening | ORAL | 3 refills | 45.00000 days | Status: AC
Start: 2024-09-11 — End: ?
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
## 2024-10-08 ENCOUNTER — Inpatient Hospital Stay: Admit: 2024-10-08 | Primary: Family

## 2024-10-08 ENCOUNTER — Telehealth: Admit: 2024-10-08 | Discharge: 2024-10-08 | Attending: Family | Primary: Family

## 2024-10-08 ENCOUNTER — Ambulatory Visit: Admit: 2024-10-08 | Discharge: 2024-10-08 | Primary: Family

## 2024-10-08 DIAGNOSIS — Z0000 Encounter for general adult medical examination without abnormal findings: Principal | ICD-10-CM

## 2024-10-08 DIAGNOSIS — R519 Headache, unspecified: Principal | ICD-10-CM

## 2024-10-08 LAB — URINALYSIS WITH REFLEX TO CULTURE
Bilirubin, Urine: NEGATIVE
Blood, Urine: NEGATIVE
Glucose, Ur: NORMAL mg/dL
Ketones, Urine: NEGATIVE mg/dL
Leukocyte Esterase, Urine: NEGATIVE uL
Nitrite, Urine: NEGATIVE
Protein, Urine: NEGATIVE mg/dL
Specific Gravity, UA: 1.01 (ref 1.008–1.030)
Urobilinogen, Urine: NORMAL EU/dL
pH, Urine: 6 (ref 5.0–8.0)
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
## 2024-10-08 NOTE — Progress Notes (Signed)
 Lab(s) Drawn:   1 lt grn, 1 lav  Drawn at (facility):   [facility]  Needle used:  21 gauge  Sample obtained from:  Right arm  Performed by: ROSINA JONELLE DARING 10/08/2024

## 2024-10-09 LAB — C.TRACHOMATIS N.GONORRHOEAE DNA
Chlamydia trachomatis, NAA: NOT DETECTED
Neisseria Gonorrhoeae, NAA: NOT DETECTED

## 2024-10-09 LAB — IRON PROFILE
Iron % Saturation: 12 % — ABNORMAL LOW (ref 15–50)
Iron: 51 ug/dL (ref 50–170)
TIBC: 409 ug/dL (ref 250–450)

## 2024-10-09 LAB — FERRITIN: Ferritin: 5.6 ng/mL — ABNORMAL LOW (ref 8–252)

## 2024-10-09 LAB — VITAMIN B12: Vitamin B-12: 705 pg/mL (ref 193–986)

## 2024-10-11 ENCOUNTER — Telehealth: Admit: 2024-10-11 | Discharge: 2024-10-11 | Attending: Family | Primary: Family

## 2024-10-11 DIAGNOSIS — D509 Iron deficiency anemia, unspecified: Principal | ICD-10-CM

## 2024-10-11 MED ORDER — FERROUS SULFATE 325 (65 FE) MG PO TABS
325 | ORAL_TABLET | Freq: Two times a day (BID) | ORAL | 1 refills | 30.00000 days | Status: AC
Start: 2024-10-11 — End: ?

## 2024-10-11 NOTE — Progress Notes (Signed)
 [facility]   [address]  [place] [address]      VIRTUAL TELEHEALTH VISIT (Performed via Telephone or Audio & Video)     VISIT TYPE:  Audio only (Telephone) - patient cannot do a video visit due to (lack of equipment, data or connectivity) CALL MOBILE -   Telephone Information:   Mobile      PROVIDER LOCATION:  HOME  PATIENT LOCATION AT TIME OF VISIT:    HOME  PARTICIPANT(S): Patient    BILLING CODE:  Standard E/M Billing Codes:  Billing for this visit is not time based - Bill E/M Code within LOS activity  TOTAL TIME SPENT ON ENCOUNTER:  20    Review of electronic health record:  I specifically reviewed: Labs and Previous Office Encounters  Problems, Meds and Allergies were reviewed.  Patient gave verbal consent for this TeleHealth visit.       CHIEF COMPLAINT   Pt presents for a Tele-Health visit today for anemia  .  HISTORY OF PRESENT ILLNESS     VV conducted today at pt request. Two identifiers used for pt and they are aware this is a telehealth appointment and consent to conducted the visit with them today.     VV conducted today with pt consent after explained this is a virtual medical visit. Conducted to review labs that were abnormal.     B12, UA, GC WNL  Iron 12, Ferritin 5.6    Has hx of IDA and took some medications, but did not take regularly and did not followup. Pt is feeling tired, some bruising, mild SOB with activity and understands this may be associated with low iron.     PHYSICAL EXAM   Alert and conversant without audible cough, wheeze or SOB, answering questions clearly and concisely, no slurred speech  ROS: see hpi    MEDICATIONS     Current Outpatient Medications   Medication Sig    ferrous sulfate  (IRON 325) 325 (65 Fe) MG tablet Take 1 tablet by mouth 2 times daily    amitriptyline  (ELAVIL ) 10 MG tablet Take 1 tablet by mouth nightly For headache prevention    ondansetron  (ZOFRAN -ODT) 4 MG disintegrating tablet Take 1 tablet by mouth 3 times daily as needed for  Nausea or Vomiting    acetaminophen  (TYLENOL ) 500 MG tablet Take 1 tablet by mouth 4 times daily as needed for Pain (Patient not taking: Reported on 07/15/2024)    albuterol  sulfate HFA (PROVENTIL ;VENTOLIN ;PROAIR ) 108 (90 Base) MCG/ACT inhaler Inhale 2 puffs into the lungs every 6 hours as needed     No current facility-administered medications for this visit.         There are no discontinued medications.  ALLERGIES     No Known Allergies    ACTIVE MEDICAL PROBLEMS     There is no problem list on file for this patient.    SOCIAL HISTORY     Social History     Social History Narrative    Not on file     VITALS   There were no vitals filed for this visit. - There is no height or weight on file to calculate BMI.  ORDERS & MEDS (NEW)     No orders of the defined types were placed in this encounter.    ASSESSMENT AND PLAN       1. Iron deficiency anemia, unspecified iron deficiency anemia type  -     Iron; Future  -  Ferritin; Future  -     CBC with Auto Differential; Future      Pt agreeable to starting Iron 325mg  BID, take with Vit C source, f/u labs in 3 months, call if worsening fatigue, bruising, SOB with activyt and PRN.     Follow up:  No follow-ups on file.     No future appointments.    Dorothy JONETTA Reason, FNP  10/11/2024

## 2024-10-11 NOTE — Result Encounter Note (Signed)
 VV conducted
